# Patient Record
Sex: Female | Born: 1935 | Race: White | Hispanic: No | Marital: Married | Smoking: Former smoker
Health system: Southern US, Community
[De-identification: ages and names within clinical notes are randomized; demographics above are authoritative.]

## PROBLEM LIST (undated history)

## (undated) DIAGNOSIS — I2510 Atherosclerotic heart disease of native coronary artery without angina pectoris: Secondary | ICD-10-CM

## (undated) DIAGNOSIS — I714 Abdominal aortic aneurysm, without rupture, unspecified: Secondary | ICD-10-CM

## (undated) DIAGNOSIS — K5790 Diverticulosis of intestine, part unspecified, without perforation or abscess without bleeding: Secondary | ICD-10-CM

## (undated) DIAGNOSIS — J449 Chronic obstructive pulmonary disease, unspecified: Secondary | ICD-10-CM

## (undated) DIAGNOSIS — I4892 Unspecified atrial flutter: Secondary | ICD-10-CM

## (undated) DIAGNOSIS — E119 Type 2 diabetes mellitus without complications: Secondary | ICD-10-CM

## (undated) DIAGNOSIS — K219 Gastro-esophageal reflux disease without esophagitis: Secondary | ICD-10-CM

## (undated) DIAGNOSIS — M1990 Unspecified osteoarthritis, unspecified site: Secondary | ICD-10-CM

## (undated) DIAGNOSIS — I10 Essential (primary) hypertension: Secondary | ICD-10-CM

## (undated) DIAGNOSIS — C801 Malignant (primary) neoplasm, unspecified: Secondary | ICD-10-CM

## (undated) DIAGNOSIS — N3090 Cystitis, unspecified without hematuria: Secondary | ICD-10-CM

## (undated) HISTORY — PX: ABDOMINAL HYSTERECTOMY: SHX81

## (undated) HISTORY — PX: EYE SURGERY: SHX253

## (undated) HISTORY — PX: BACK SURGERY: SHX140

## (undated) HISTORY — PX: ATRIAL CARDIAC PACEMAKER INSERTION: SHX561

## (undated) HISTORY — PX: CHOLECYSTECTOMY: SHX55

---

## 2005-10-26 ENCOUNTER — Emergency Department: Payer: Self-pay | Admitting: Emergency Medicine

## 2008-07-07 ENCOUNTER — Inpatient Hospital Stay: Payer: Self-pay | Admitting: Internal Medicine

## 2016-05-01 ENCOUNTER — Encounter (HOSPITAL_COMMUNITY): Payer: Self-pay

## 2016-05-01 ENCOUNTER — Emergency Department (HOSPITAL_COMMUNITY): Payer: Medicare Other

## 2016-05-01 ENCOUNTER — Emergency Department (HOSPITAL_COMMUNITY)
Admission: EM | Admit: 2016-05-01 | Discharge: 2016-05-01 | Disposition: A | Discharge status: Home / Self Care | Payer: Medicare Other | Attending: Emergency Medicine | Admitting: Emergency Medicine

## 2016-05-01 DIAGNOSIS — Z87891 Personal history of nicotine dependence: Secondary | ICD-10-CM | POA: Insufficient documentation

## 2016-05-01 DIAGNOSIS — Y999 Unspecified external cause status: Secondary | ICD-10-CM | POA: Insufficient documentation

## 2016-05-01 DIAGNOSIS — R51 Headache: Secondary | ICD-10-CM | POA: Insufficient documentation

## 2016-05-01 DIAGNOSIS — W1830XA Fall on same level, unspecified, initial encounter: Secondary | ICD-10-CM | POA: Insufficient documentation

## 2016-05-01 DIAGNOSIS — Y92002 Bathroom of unspecified non-institutional (private) residence single-family (private) house as the place of occurrence of the external cause: Secondary | ICD-10-CM | POA: Insufficient documentation

## 2016-05-01 DIAGNOSIS — I2510 Atherosclerotic heart disease of native coronary artery without angina pectoris: Secondary | ICD-10-CM | POA: Insufficient documentation

## 2016-05-01 DIAGNOSIS — Z8505 Personal history of malignant neoplasm of liver: Secondary | ICD-10-CM | POA: Diagnosis not present

## 2016-05-01 DIAGNOSIS — M25552 Pain in left hip: Secondary | ICD-10-CM | POA: Diagnosis present

## 2016-05-01 DIAGNOSIS — W19XXXA Unspecified fall, initial encounter: Secondary | ICD-10-CM

## 2016-05-01 DIAGNOSIS — J449 Chronic obstructive pulmonary disease, unspecified: Secondary | ICD-10-CM | POA: Diagnosis not present

## 2016-05-01 DIAGNOSIS — I10 Essential (primary) hypertension: Secondary | ICD-10-CM | POA: Diagnosis not present

## 2016-05-01 DIAGNOSIS — Y939 Activity, unspecified: Secondary | ICD-10-CM | POA: Insufficient documentation

## 2016-05-01 HISTORY — DX: Abdominal aortic aneurysm, without rupture: I71.4

## 2016-05-01 HISTORY — DX: Diverticulosis of intestine, part unspecified, without perforation or abscess without bleeding: K57.90

## 2016-05-01 HISTORY — DX: Unspecified osteoarthritis, unspecified site: M19.90

## 2016-05-01 HISTORY — DX: Atherosclerotic heart disease of native coronary artery without angina pectoris: I25.10

## 2016-05-01 HISTORY — DX: Essential (primary) hypertension: I10

## 2016-05-01 HISTORY — DX: Malignant (primary) neoplasm, unspecified: C80.1

## 2016-05-01 HISTORY — DX: Chronic obstructive pulmonary disease, unspecified: J44.9

## 2016-05-01 HISTORY — DX: Unspecified atrial flutter: I48.92

## 2016-05-01 HISTORY — DX: Gastro-esophageal reflux disease without esophagitis: K21.9

## 2016-05-01 HISTORY — DX: Type 2 diabetes mellitus without complications: E11.9

## 2016-05-01 HISTORY — DX: Cystitis, unspecified without hematuria: N30.90

## 2016-05-01 HISTORY — DX: Abdominal aortic aneurysm, without rupture, unspecified: I71.40

## 2016-05-01 LAB — URINALYSIS, ROUTINE W REFLEX MICROSCOPIC
Bilirubin Urine: NEGATIVE
Glucose, UA: NEGATIVE mg/dL
Hgb urine dipstick: NEGATIVE
Ketones, ur: NEGATIVE mg/dL
LEUKOCYTES UA: NEGATIVE
NITRITE: NEGATIVE
PH: 6.5 (ref 5.0–8.0)
Protein, ur: NEGATIVE mg/dL
SPECIFIC GRAVITY, URINE: 1.014 (ref 1.005–1.030)

## 2016-05-01 LAB — COMPREHENSIVE METABOLIC PANEL
ALBUMIN: 3 g/dL — AB (ref 3.5–5.0)
ALK PHOS: 115 U/L (ref 38–126)
ALT: 23 U/L (ref 14–54)
ANION GAP: 8 (ref 5–15)
AST: 66 U/L — ABNORMAL HIGH (ref 15–41)
BILIRUBIN TOTAL: 0.8 mg/dL (ref 0.3–1.2)
BUN: 15 mg/dL (ref 6–20)
CALCIUM: 10.9 mg/dL — AB (ref 8.9–10.3)
CO2: 27 mmol/L (ref 22–32)
Chloride: 101 mmol/L (ref 101–111)
Creatinine, Ser: 0.86 mg/dL (ref 0.44–1.00)
GFR calc Af Amer: >60 mL/min (ref >60)
GFR calc non Af Amer: 58 mL/min — ABNORMAL LOW (ref >60)
GLUCOSE: 127 mg/dL — AB (ref 65–99)
Potassium: 4.1 mmol/L (ref 3.5–5.1)
Sodium: 136 mmol/L (ref 135–145)
TOTAL PROTEIN: 6.5 g/dL (ref 6.5–8.1)

## 2016-05-01 LAB — CBC WITH DIFFERENTIAL/PLATELET
BASOS PCT: 0 %
Basophils Absolute: 0 10*3/uL (ref 0.0–0.1)
Eosinophils Absolute: 0.1 10*3/uL (ref 0.0–0.7)
Eosinophils Relative: 2 %
HEMATOCRIT: 39.6 % (ref 36.0–46.0)
HEMOGLOBIN: 12.4 g/dL (ref 12.0–15.0)
LYMPHS ABS: 0.4 10*3/uL — AB (ref 0.7–4.0)
LYMPHS PCT: 9 %
MCH: 29.9 pg (ref 26.0–34.0)
MCHC: 31.3 g/dL (ref 30.0–36.0)
MCV: 95.4 fL (ref 78.0–100.0)
MONOS PCT: 13 %
Monocytes Absolute: 0.6 10*3/uL (ref 0.1–1.0)
NEUTROS ABS: 3.5 10*3/uL (ref 1.7–7.7)
NEUTROS PCT: 76 %
Platelets: 125 10*3/uL — ABNORMAL LOW (ref 150–400)
RBC: 4.15 MIL/uL (ref 3.87–5.11)
RDW: 14 % (ref 11.5–15.5)
WBC: 4.6 10*3/uL (ref 4.0–10.5)

## 2016-05-01 LAB — PROTIME-INR
INR: 2.44
Prothrombin Time: 26.9 seconds — ABNORMAL HIGH (ref 11.4–15.2)

## 2016-05-01 NOTE — ED Notes (Signed)
Patient transported to X-ray 

## 2016-05-01 NOTE — ED Notes (Signed)
Pt ambulates in hall with walker and staff. Fairly steady gait.

## 2016-05-01 NOTE — ED Triage Notes (Signed)
Pt arrives EMS from home with c/o fall from  Standing. Pt denies striking head or loc but does take blood thinners.  3 fall in last 3 days. Alert and oriented x 2,  maew resp even and non labored.

## 2016-05-01 NOTE — ED Provider Notes (Signed)
[facility] DEPT Provider Note   CSN: [id number] Arrival date & time: 05/01/16  0815     History   Chief Complaint Chief Complaint  Patient presents with  . Fall    HPI Kristen Perkins is a 82 y.o. female.   Fall  This is a new problem. The current episode started 12 to 24 hours ago. The problem occurs constantly. The problem has not changed since onset.Pertinent negatives include no chest pain and no shortness of breath. Nothing aggravates the symptoms. Nothing relieves the symptoms. She has tried nothing for the symptoms.    Past Medical History:  Diagnosis Date  . AAA (abdominal aortic aneurysm) ([place])   . Atrial flutter ([place])   . Cancer ([place])    liver  . COPD (chronic obstructive pulmonary disease) ([place])   . Coronary artery disease   . Cystitis   . Diabetes mellitus without complication ([place])   . Diverticulosis   . DJD (degenerative joint disease)   . GERD (gastroesophageal reflux disease)   . Hypertension     There are no active problems to display for this patient.   Past Surgical History:  Procedure Laterality Date  . ABDOMINAL HYSTERECTOMY    . ATRIAL CARDIAC PACEMAKER INSERTION    . BACK SURGERY    . CHOLECYSTECTOMY    . EYE SURGERY      OB History    Gravida Para Term Preterm AB Living   6 3           SAB TAB Ectopic Multiple Live Births                   Home Medications    Prior to Admission medications   Not on File    Family History No family history on file.  Social History Social History  Substance Use Topics  . Smoking status: Former Research scientist (life sciences)  . Smokeless tobacco: Former Systems developer  . Alcohol use No     Allergies   Codeine; Penicillins; and Sulfa antibiotics   Review of Systems Review of Systems  Respiratory: Negative for shortness of breath.   Cardiovascular: Negative for chest pain.  All other systems reviewed and are negative.    Physical Exam Updated Vital Signs BP 134/82 (BP Location: Right Arm)   Pulse 74    Temp 98.1 F (36.7 C) (Oral)   Resp 18   Ht 5\' 4"  (1.626 m)   Wt 190 lb (86.2 kg)   LMP  (LMP Unknown) Comment: hysterectomy  SpO2 99%   BMI 32.61 kg/m   Physical Exam  Constitutional: She is oriented to person, place, and time. She appears well-developed and well-nourished. No distress.  HENT:  Head: Normocephalic and atraumatic.  Eyes: Conjunctivae are normal.  Neck: Neck supple.  Cardiovascular: Normal rate and regular rhythm.   No murmur heard. Pulmonary/Chest: Effort normal and breath sounds normal. No respiratory distress.  Abdominal: Soft. There is no tenderness.  Musculoskeletal: She exhibits no edema or deformity.  Neurological: She is alert and oriented to person, place, and time.  Skin: Skin is warm and dry.  Psychiatric: She has a normal mood and affect.  Nursing note and vitals reviewed.    ED Treatments / Results  Labs (all labs ordered are listed, but only abnormal results are displayed) Labs Reviewed  CBC WITH DIFFERENTIAL/PLATELET - Abnormal; Notable for the following:       Result Value   Platelets 125 (*)    Lymphs Abs 0.4 (*)    All  other components within normal limits  COMPREHENSIVE METABOLIC PANEL - Abnormal; Notable for the following:    Glucose, Bld 127 (*)    Calcium 10.9 (*)    Albumin 3.0 (*)    AST 66 (*)    GFR calc non Af Amer 58 (*)    All other components within normal limits  PROTIME-INR - Abnormal; Notable for the following:    Prothrombin Time 26.9 (*)    All other components within normal limits  URINALYSIS, ROUTINE W REFLEX MICROSCOPIC (NOT AT [facility])    EKG  EKG Interpretation None       Radiology Dg Chest 2 View  Result Date: 05/01/2016 CLINICAL DATA:  Status post fall in the bathroom this morning. No current chest complaints. History of atrial flutter, Cels[place] OPD, coronary artery disease, former smoker. EXAM: CHEST  2 VIEW COMPARISON:  PA and lateral chest x-ray of July 07, 2008 FINDINGS: The lungs are reasonably  well inflated. There is no focal infiltrate or pleural effusion. The heart is mildly enlarged. The pulmonary vascularity is normal. There is mitral annular calcification. There is calcification in the wall of the thoracic aorta. The permanent pacemaker electrodes are in stable position. The bony thorax exhibits no acute abnormality. IMPRESSION: Mild cardiomegaly without pulmonary edema. No evidence of acute thoracic trauma. Aortic atherosclerosis. Electronically Signed   By: David  Martinique M.D.   On: 05/01/2016 10:11   Dg Pelvis 1-2 Views  Result Date: 05/01/2016 CLINICAL DATA:  82 year old female fell in bathroom.  Left hip pain. EXAM: PELVIS - 1-2 VIEW COMPARISON:  None. FINDINGS: No fracture identified. If left hip fracture remained of high clinical concern, cone-down plain film views, CT or MR can be obtained for further delineation. Degenerative changes pubic symphysis with bony overgrowth. Mild degenerative changes lower lumbar spine. Vascular calcifications. IMPRESSION: No fracture noted.  Please see above discussion. Electronically Signed   By: Genia Del M.D.   On: 05/01/2016 10:13   Ct Head Wo Contrast  Result Date: 05/01/2016 CLINICAL DATA:  Pain following fall EXAM: CT HEAD WITHOUT CONTRAST CT CERVICAL SPINE WITHOUT CONTRAST TECHNIQUE: Multidetector CT imaging of the head and cervical spine was performed following the standard protocol without intravenous contrast. Multiplanar CT image reconstructions of the cervical spine were also generated. COMPARISON:  None. FINDINGS: CT HEAD FINDINGS Brain: There is moderate diffuse atrophy. There is no intracranial mass hemorrhage, extra-axial fluid collection, or midline shift. There is patchy small vessel disease throughout the centra semiovale bilaterally. Elsewhere gray-white compartments appear normal. No acute infarct evident. Vascular: There are no hyperdense vessels. There is calcification in each distal vertebral artery as well as foci of  calcification in each carotid siphon region. Skull: Bony calvarium appears intact. Sinuses/Orbits: Paranasal sinuses are clear. Orbits appear symmetric bilaterally. Other: Mastoid air cells are clear. CT CERVICAL SPINE FINDINGS Alignment: There is no demonstrable spondylolisthesis. Skull base and vertebrae: Pannus surrounds the posterior odontoid without significant impression on the craniocervical junction. There is evidence of postoperative change with anterior screw and plate fixation from C3 does C6. The patient has undergone posterior laminectomies from C4-C7. There is extensive osteoporosis, most severe in the areas of postoperative change. There are prominent cystic areas in the odontoid. There is no acute fracture evident. Soft tissues and spinal canal: Prevertebral soft tissues and predental space regions are normal. There is no evidence of spinal stenosis. Disc levels: There are disc spacers at C3-4, C4-5, and C5-6 with partial ankylosis at these levels. There is moderately severe  disc space narrowing at C6-7. There is moderate disc space narrowing at C7-T1. There is facet hypertrophy at most levels bilaterally. There is moderate exit foraminal narrowing at C3-4, C4-5, C5-6, and C6-7. No frank disc extrusion noted. Upper chest: Visualized lung apices appear clear. Other: There is calcification in both carotid arteries. Calcification is also noted in the visualized subclavian arteries bilaterally. IMPRESSION: CT head: Moderate atrophy with patchy periventricular small vessel disease. No intracranial mass, hemorrhage, or extra-axial fluid collection. No acute infarct evident. Areas of arterial vascular calcification noted. CT cervical spine: No acute fracture or spondylolisthesis. Extensive postoperative change. Multilevel arthropathy. Bones diffusely osteoporotic. Cystic areas are noted in the odontoid region, likely of arthropathic etiology. Moderate pannus is noted posterior to the odontoid without  significant narrowing of the craniocervical junction. There is extensive vascular calcification bilaterally, including carotid artery calcification bilaterally. Electronically Signed   By: Lowella Grip III M.D.   On: 05/01/2016 10:34   Ct Cervical Spine Wo Contrast  Result Date: 05/01/2016 CLINICAL DATA:  Pain following fall EXAM: CT HEAD WITHOUT CONTRAST CT CERVICAL SPINE WITHOUT CONTRAST TECHNIQUE: Multidetector CT imaging of the head and cervical spine was performed following the standard protocol without intravenous contrast. Multiplanar CT image reconstructions of the cervical spine were also generated. COMPARISON:  None. FINDINGS: CT HEAD FINDINGS Brain: There is moderate diffuse atrophy. There is no intracranial mass hemorrhage, extra-axial fluid collection, or midline shift. There is patchy small vessel disease throughout the centra semiovale bilaterally. Elsewhere gray-white compartments appear normal. No acute infarct evident. Vascular: There are no hyperdense vessels. There is calcification in each distal vertebral artery as well as foci of calcification in each carotid siphon region. Skull: Bony calvarium appears intact. Sinuses/Orbits: Paranasal sinuses are clear. Orbits appear symmetric bilaterally. Other: Mastoid air cells are clear. CT CERVICAL SPINE FINDINGS Alignment: There is no demonstrable spondylolisthesis. Skull base and vertebrae: Pannus surrounds the posterior odontoid without significant impression on the craniocervical junction. There is evidence of postoperative change with anterior screw and plate fixation from C3 does C6. The patient has undergone posterior laminectomies from C4-C7. There is extensive osteoporosis, most severe in the areas of postoperative change. There are prominent cystic areas in the odontoid. There is no acute fracture evident. Soft tissues and spinal canal: Prevertebral soft tissues and predental space regions are normal. There is no evidence of spinal  stenosis. Disc levels: There are disc spacers at C3-4, C4-5, and C5-6 with partial ankylosis at these levels. There is moderately severe disc space narrowing at C6-7. There is moderate disc space narrowing at C7-T1. There is facet hypertrophy at most levels bilaterally. There is moderate exit foraminal narrowing at C3-4, C4-5, C5-6, and C6-7. No frank disc extrusion noted. Upper chest: Visualized lung apices appear clear. Other: There is calcification in both carotid arteries. Calcification is also noted in the visualized subclavian arteries bilaterally. IMPRESSION: CT head: Moderate atrophy with patchy periventricular small vessel disease. No intracranial mass, hemorrhage, or extra-axial fluid collection. No acute infarct evident. Areas of arterial vascular calcification noted. CT cervical spine: No acute fracture or spondylolisthesis. Extensive postoperative change. Multilevel arthropathy. Bones diffusely osteoporotic. Cystic areas are noted in the odontoid region, likely of arthropathic etiology. Moderate pannus is noted posterior to the odontoid without significant narrowing of the craniocervical junction. There is extensive vascular calcification bilaterally, including carotid artery calcification bilaterally. Electronically Signed   By: Lowella Grip III M.D.   On: 05/01/2016 10:34    Procedures Procedures (including critical care time)  Medications  Ordered in ED Medications - No data to display   Initial Impression / Assessment and Plan / ED Course  I have reviewed the triage vital signs and the nursing notes.  Pertinent labs & imaging results that were available during my care of the patient were reviewed by me and considered in my medical decision making (see chart for details).  Clinical Course    H/o falls, increased frequency last couple days. Likely needs PT, but doesn't live here so will work on getting it set up at home.  Labs/imaging negative for injuries or acute causes.    Final Clinical Impressions(s) / ED Diagnoses   Final diagnoses:  Fall, initial encounter    New Prescriptions There are no discharge medications for this patient.    Merrily Pew, MD 05/02/16 1227

## 2016-11-04 DEATH — deceased

## 2016-12-04 DEATH — deceased

## 2017-02-26 IMAGING — CT CT CERVICAL SPINE W/O CM
4 of 8 series · 14 of 33 positions shown, 15 images · non-contrast
Comparison: None.

CLINICAL DATA: Pain following fall

EXAM:
CT HEAD WITHOUT CONTRAST
CT CERVICAL SPINE WITHOUT CONTRAST
TECHNIQUE: Multidetector CT imaging of the head and cervical spine was
performed following the standard protocol without intravenous
contrast. Multiplanar CT image reconstructions of the cervical spine
were also generated.

[Series 203: coronal st, idose (1) · coronal · 0.40mm/px · 3 of 74 slices shown]
[im 19/74  bone]
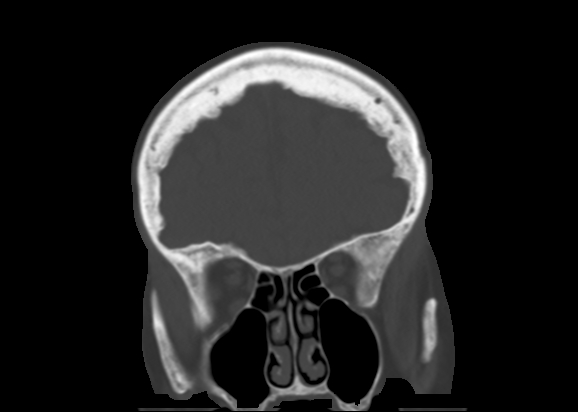
[im 37/74  bone]
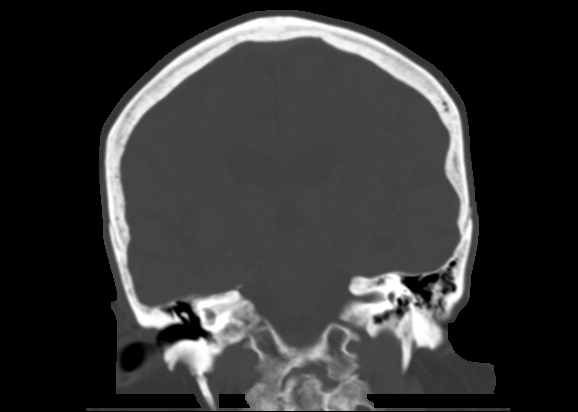
[im 55/74  bone]
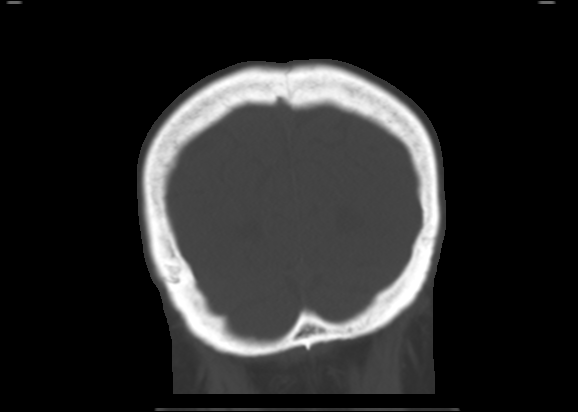

[Series 302: soft tissue, idose (2) · axial · 0.34mm/px · z∈[+974,+1068]mm · 3 of 95 slices shown]
[im 24/95  soft-tissue]
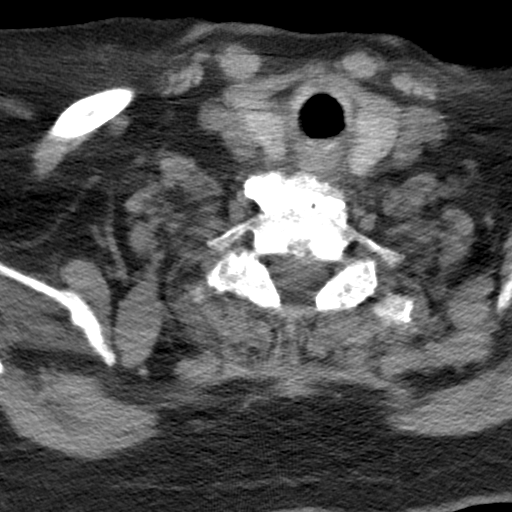
[im 48/95  soft-tissue]
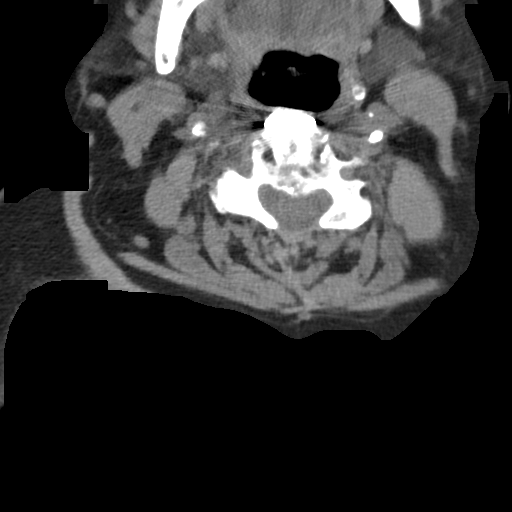
[im 71/95  soft-tissue]
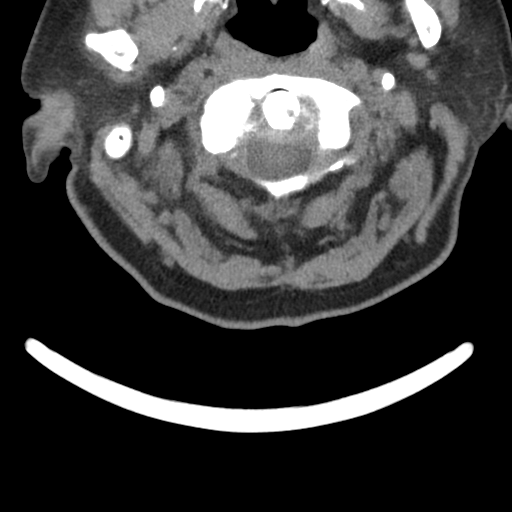

[Series 305: sagittal, idose (2) · sagittal · 0.34mm/px · 5 of 86 slices shown]
[im 15/86  bone]
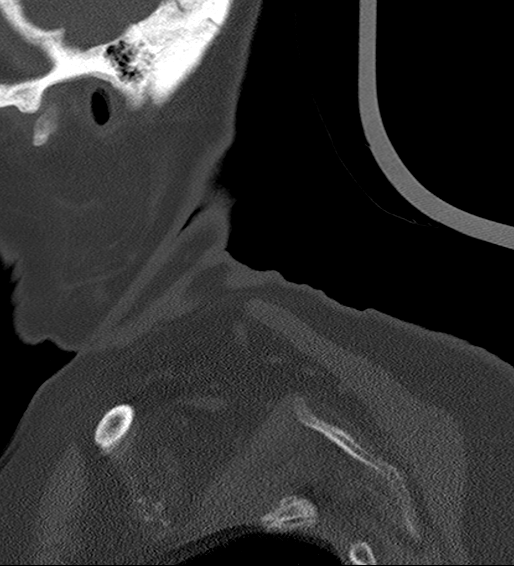
[im 29/86  bone]
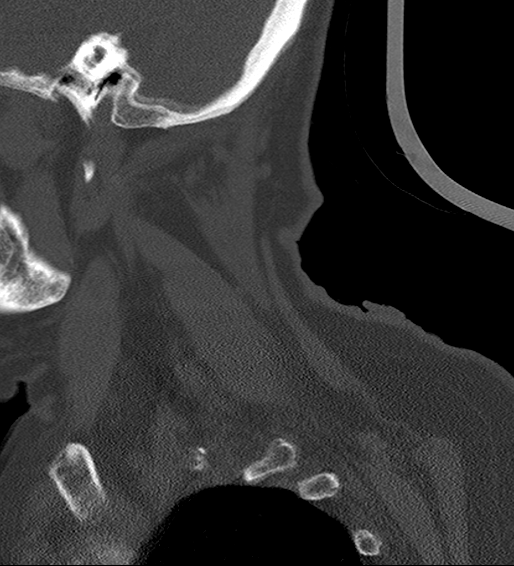
[im 43/86  bone]
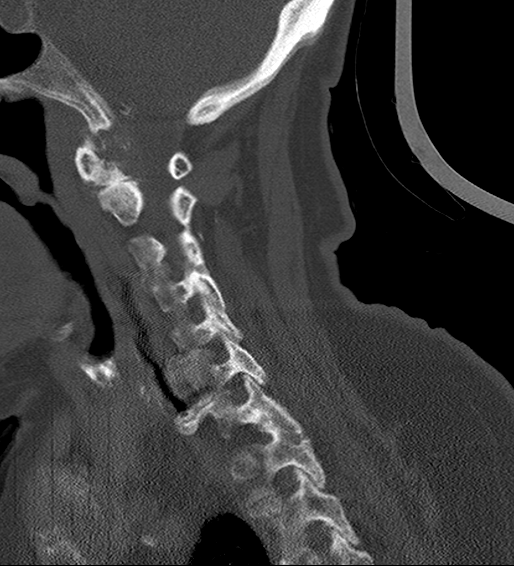
[im 57/86  bone]
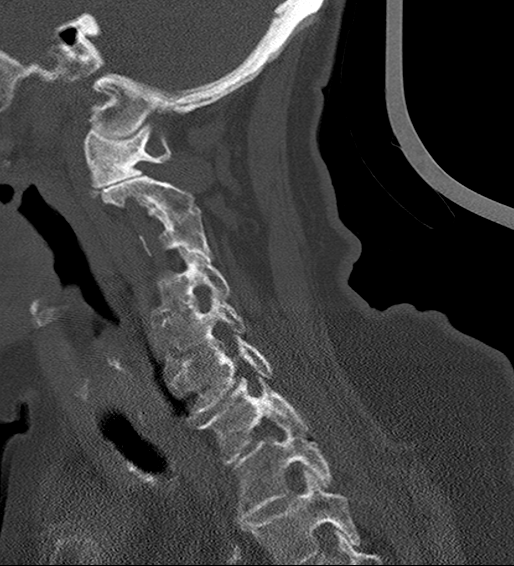
[im 71/86  bone]
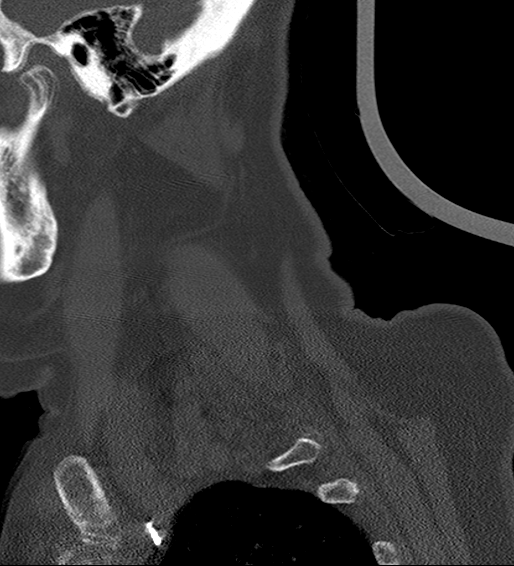

[Series 306: orthogonals, idose (2) · axial · 0.42mm/px · z∈[+955,+1047]mm · 3 of 95 slices shown, 4 images]
[im 24/95  soft-tissue]
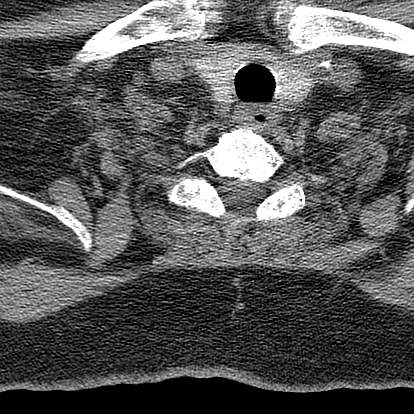
[im 24/95  bone]
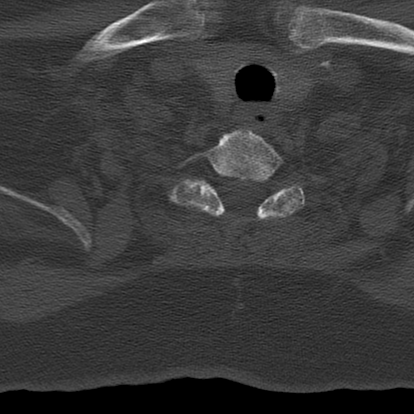
[im 48/95  bone]
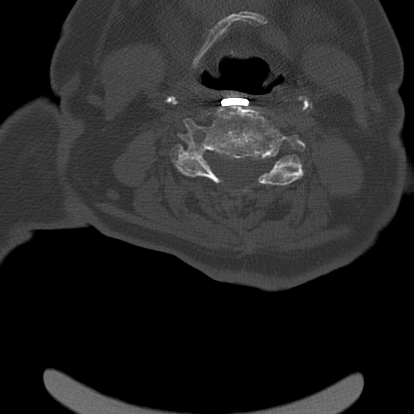
[im 71/95  bone]
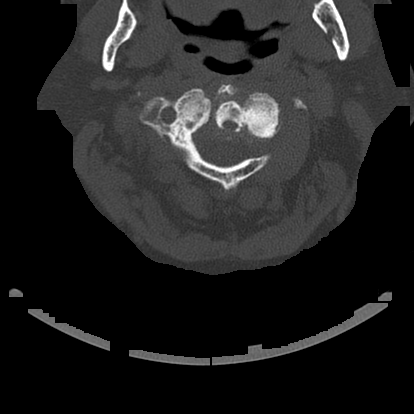

[14 of 33 positions shown; findings below may reference images not displayed]

FINDINGS: CT HEAD FINDINGS

Brain: There is moderate diffuse atrophy. There is no intracranial
mass hemorrhage, extra-axial fluid collection, or midline shift.
There is patchy small vessel disease throughout the centra semiovale
bilaterally. Elsewhere gray-white compartments appear normal. No
acute infarct evident.

Vascular: There are no hyperdense vessels. There is calcification in
each distal vertebral artery as well as foci of calcification in
each carotid siphon region.

Skull: Bony calvarium appears intact.

Sinuses/Orbits: Paranasal sinuses are clear. Orbits appear symmetric
bilaterally.

Other: Mastoid air cells are clear.

CT CERVICAL SPINE FINDINGS

Alignment: There is no demonstrable spondylolisthesis.

Skull base and vertebrae: Pannus surrounds the posterior odontoid
without significant impression on the craniocervical junction. There
is evidence of postoperative change with anterior screw and plate
fixation from C3 does C6. The patient has undergone posterior
laminectomies from C4-C7. There is extensive osteoporosis, most
severe in the areas of postoperative change. There are prominent
cystic areas in the odontoid. There is no acute fracture evident.

Soft tissues and spinal canal: Prevertebral soft tissues and
predental space regions are normal. There is no evidence of spinal
stenosis.

Disc levels: There are disc spacers at C3-4, C4-5, and C5-6 with
partial ankylosis at these levels. There is moderately severe disc
space narrowing at C6-7. There is moderate disc space narrowing at
C7-T1. There is facet hypertrophy at most levels bilaterally. There
is moderate exit foraminal narrowing at C3-4, C4-5, C5-6, and C6-7.
No frank disc extrusion noted.

Upper chest: Visualized lung apices appear clear.

Other: There is calcification in both carotid arteries.
Calcification is also noted in the visualized subclavian arteries
bilaterally.
IMPRESSION: CT head: Moderate atrophy with patchy periventricular small vessel
disease. No intracranial mass, hemorrhage, or extra-axial fluid
collection. No acute infarct evident. Areas of arterial vascular
calcification noted.

CT cervical spine: No acute fracture or spondylolisthesis. Extensive
postoperative change. Multilevel arthropathy. Bones diffusely
osteoporotic. Cystic areas are noted in the odontoid region, likely
of arthropathic etiology. Moderate pannus is noted posterior to the
odontoid without significant narrowing of the craniocervical
junction. There is extensive vascular calcification bilaterally,
including carotid artery calcification bilaterally.
